# Patient Record
Sex: Female | Born: 1990 | Race: White | Hispanic: No | Marital: Single | Smoking: Former smoker
Health system: Southern US, Community
[De-identification: ages and names within clinical notes are randomized; demographics above are authoritative.]

## PROBLEM LIST (undated history)

## (undated) DIAGNOSIS — K8020 Calculus of gallbladder without cholecystitis without obstruction: Secondary | ICD-10-CM

## (undated) DIAGNOSIS — K219 Gastro-esophageal reflux disease without esophagitis: Secondary | ICD-10-CM

## (undated) HISTORY — PX: NO PAST SURGERIES: SHX2092

---

## 2011-09-07 DIAGNOSIS — E669 Obesity, unspecified: Secondary | ICD-10-CM | POA: Insufficient documentation

## 2013-02-04 ENCOUNTER — Emergency Department: Payer: Self-pay | Admitting: Emergency Medicine

## 2013-02-04 LAB — CBC WITH DIFFERENTIAL/PLATELET
Basophil %: 0.6 %
MCH: 28.8 pg (ref 26.0–34.0)
Monocyte #: 0.6 x10 3/mm (ref 0.2–0.9)
Neutrophil #: 6.7 10*3/uL — ABNORMAL HIGH (ref 1.4–6.5)
Neutrophil %: 61.6 %
Platelet: 290 10*3/uL (ref 150–440)
RBC: 4.78 10*6/uL (ref 3.80–5.20)
RDW: 13.1 % (ref 11.5–14.5)
WBC: 10.8 10*3/uL (ref 3.6–11.0)

## 2013-02-04 LAB — COMPREHENSIVE METABOLIC PANEL
Alkaline Phosphatase: 79 U/L (ref 50–136)
Anion Gap: 5 — ABNORMAL LOW (ref 7–16)
BUN: 12 mg/dL (ref 7–18)
Chloride: 105 mmol/L (ref 98–107)
Co2: 27 mmol/L (ref 21–32)
Creatinine: 0.83 mg/dL (ref 0.60–1.30)
Glucose: 90 mg/dL (ref 65–99)
Potassium: 3.6 mmol/L (ref 3.5–5.1)
SGPT (ALT): 26 U/L (ref 12–78)

## 2016-08-08 ENCOUNTER — Encounter: Payer: Self-pay | Admitting: Internal Medicine

## 2017-05-27 DIAGNOSIS — N96 Recurrent pregnancy loss: Secondary | ICD-10-CM | POA: Insufficient documentation

## 2018-04-12 ENCOUNTER — Other Ambulatory Visit: Payer: Self-pay

## 2018-04-12 DIAGNOSIS — K8020 Calculus of gallbladder without cholecystitis without obstruction: Secondary | ICD-10-CM | POA: Diagnosis not present

## 2018-04-12 DIAGNOSIS — R1011 Right upper quadrant pain: Secondary | ICD-10-CM | POA: Diagnosis present

## 2018-04-12 NOTE — ED Triage Notes (Signed)
Patient reports right rib pain and central upper chest pain.

## 2018-04-13 ENCOUNTER — Emergency Department: Payer: 59

## 2018-04-13 ENCOUNTER — Emergency Department
Admission: EM | Admit: 2018-04-13 | Discharge: 2018-04-13 | Disposition: A | Discharge status: Home / Self Care | Payer: 59 | Attending: Emergency Medicine | Admitting: Emergency Medicine

## 2018-04-13 DIAGNOSIS — R52 Pain, unspecified: Secondary | ICD-10-CM

## 2018-04-13 DIAGNOSIS — K8020 Calculus of gallbladder without cholecystitis without obstruction: Secondary | ICD-10-CM

## 2018-04-13 LAB — CBC
HCT: 39.5 % (ref 36.0–46.0)
Hemoglobin: 13.6 g/dL (ref 12.0–15.0)
MCH: 28.2 pg (ref 26.0–34.0)
MCHC: 34.4 g/dL (ref 30.0–36.0)
MCV: 81.8 fL (ref 80.0–100.0)
Platelets: 318 10*3/uL (ref 150–400)
RBC: 4.83 MIL/uL (ref 3.87–5.11)
RDW: 13.1 % (ref 11.5–15.5)
WBC: 11.1 10*3/uL — ABNORMAL HIGH (ref 4.0–10.5)
nRBC: 0 % (ref 0.0–0.2)

## 2018-04-13 LAB — TROPONIN I: Troponin I: <0.03 ng/mL (ref <0.03)

## 2018-04-13 LAB — BASIC METABOLIC PANEL
Anion gap: 6 (ref 5–15)
BUN: 14 mg/dL (ref 6–20)
CALCIUM: 8.5 mg/dL — AB (ref 8.9–10.3)
CO2: 22 mmol/L (ref 22–32)
Chloride: 108 mmol/L (ref 98–111)
Creatinine, Ser: 0.62 mg/dL (ref 0.44–1.00)
GFR calc Af Amer: >60 mL/min (ref >60)
GFR calc non Af Amer: >60 mL/min (ref >60)
Glucose, Bld: 102 mg/dL — ABNORMAL HIGH (ref 70–99)
Potassium: 3.6 mmol/L (ref 3.5–5.1)
Sodium: 136 mmol/L (ref 135–145)

## 2018-04-13 LAB — POCT PREGNANCY, URINE: Preg Test, Ur: NEGATIVE

## 2018-04-13 MED ORDER — TRAMADOL HCL 50 MG PO TABS: 50.0000 mg | ORAL_TABLET | Freq: Four times a day (QID) | ORAL | 0 refills | Status: DC | PRN

## 2018-04-13 MED ORDER — ONDANSETRON 4 MG PO TBDP: 4.0000 mg | ORAL_TABLET | Freq: Three times a day (TID) | ORAL | 0 refills | Status: DC | PRN

## 2018-04-13 MED ORDER — KETOROLAC TROMETHAMINE 30 MG/ML IJ SOLN
30.0000 mg | Freq: Once | INTRAMUSCULAR | Status: AC
  Administered 2018-04-13: 30 mg via INTRAVENOUS
  Filled 2018-04-13: qty 1

## 2018-04-13 NOTE — ED Provider Notes (Signed)
[facility] Emergency Department Provider Note  ____________________________________________   First MD Initiated Contact with Patient 04/13/18 0335     (approximate)  I have reviewed the triage vital signs and the nursing notes.   HISTORY  Chief Complaint Chest Pain    HPI Becky Hansen is a 27 y.o. female with history of cholelithiasis presents emergency department right upper quadrant abdominal pain with radiation to the central chest.  Patient states that symptoms are consistent with previous episodes of "gallbladder attacks".  Patient states that current pain score is 5 out of 10.  Patient denies any fever.  Patient does admit to occasional nausea no vomiting.  Patient denies any urinary symptoms.  Patient denies any dyspnea.   Past medical history Cholelithiasis There are no active problems to display for this patient.    Prior to Admission medications   Medication Sig Start Date End Date Taking? Authorizing Provider  ondansetron (ZOFRAN ODT) 4 MG disintegrating tablet Take 1 tablet (4 mg total) by mouth every 8 (eight) hours as needed. 04/13/18   Darci CurrentBrown, [place] N, MD  traMADol (ULTRAM) 50 MG tablet Take 1 tablet (50 mg total) by mouth every 6 (six) hours as needed. 04/13/18 04/13/19  Darci CurrentBrown, [place] N, MD    Allergies No known drug allergies No family history on file.  Social History Social History   Tobacco Use  . Smoking status: Not on file  Substance Use Topics  . Alcohol use: Not on file  . Drug use: Not on file    Review of Systems Constitutional: No fever/chills Eyes: No visual changes. ENT: No sore throat. Cardiovascular: Denies chest pain. Respiratory: Denies shortness of breath. Gastrointestinal: Positive for abdominal pain and nausea. Genitourinary: Negative for dysuria. Musculoskeletal: Negative for neck pain.  Negative for back pain. Integumentary: Negative for rash. Neurological: Negative for headaches, focal  weakness or numbness.  ____________________________________________   PHYSICAL EXAM:  VITAL SIGNS: ED Triage Vitals  Enc Vitals Group     BP 04/12/18 2348 134/77     Pulse Rate 04/12/18 2348 60     Resp 04/12/18 2348 20     Temp 04/12/18 2348 98.3 F (36.8 C)     Temp Source 04/12/18 2348 Oral     SpO2 04/12/18 2348 100 %     Weight 04/12/18 2349 108.9 kg (240 lb)     Height 04/12/18 2349 1.676 m (5\' 6" )     Head Circumference --      Peak Flow --      Pain Score 04/12/18 2351 7     Pain Loc --      Pain Edu? --      Excl. in GC? --     Constitutional: Alert and oriented. Well appearing and in no acute distress. Eyes: Conjunctivae are normal.  Ears:  Healthy appearing ear canals and TMs bilaterally Nose: No congestion/rhinnorhea. Mouth/Throat: Mucous membranes are moist. Neck: No stridor.   Cardiovascular: Normal rate, regular rhythm. Good peripheral circulation. Grossly normal heart sounds. Respiratory: Normal respiratory effort.  No retractions. Lungs CTAB. Gastrointestinal: Right upper quadrant tenderness to palpation.  No distention.  Musculoskeletal: No lower extremity tenderness nor edema. No gross deformities of extremities. Neurologic:  Normal speech and language. No gross focal neurologic deficits are appreciated.  Skin:  Skin is warm, dry and intact. No rash noted.   ____________________________________________   LABS (all labs ordered are listed, but only abnormal results are displayed)  Labs Reviewed  BASIC METABOLIC PANEL -  Abnormal; Notable for the following components:      Result Value   Glucose, Bld 102 (*)    Calcium 8.5 (*)    All other components within normal limits  CBC - Abnormal; Notable for the following components:   WBC 11.1 (*)    All other components within normal limits  TROPONIN I  POC URINE PREG, ED  POCT PREGNANCY, URINE   ____________________________________________  EKG  ED ECG REPORT I, [place] N Krystelle Prashad, the attending  physician, personally viewed and interpreted this ECG.   Date: 04/12/2018  EKG Time: 11:52 PM  Rate: 64  Rhythm: Normal sinus rhythm  Axis: Normal  Intervals: Normal  ST&T Change: None  ____________________________________________  RADIOLOGY I, [place] N Varie Machamer, personally viewed and evaluated these images (plain radiographs) as part of my medical decision making, as well as reviewing the written report by the radiologist.  ED MD interpretation: No acute cardiopulmonary process noted on chest x-ray per radiologist Right upper quadrant ultrasound revealed multiple gallstones within the gallbladder without evidence of cholecystitis per radiologist.  Official radiology report(s): Dg Chest 2 View  Result Date: 04/13/2018 CLINICAL DATA:  Central chest pain. EXAM: CHEST - 2 VIEW COMPARISON:  None. FINDINGS: The heart size and mediastinal contours are within normal limits. Both lungs are clear. There is scoliosis of spine. IMPRESSION: No active cardiopulmonary disease. Electronically Signed   By: Sherian ReinWei-Chen  Lin M.D.   On: 04/13/2018 00:29   [place] Abdomen Limited Ruq  Result Date: 04/13/2018 CLINICAL DATA:  Chronic right upper quadrant abdominal pain. EXAM: ULTRASOUND ABDOMEN LIMITED RIGHT UPPER QUADRANT COMPARISON:  None. FINDINGS: Gallbladder: Numerous stones are seen filling the gallbladder, with a wall echo shadow sign. No gallbladder wall thickening or pericholecystic fluid is seen. No ultrasonographic Murphy's sign is elicited. Common bile duct: Diameter: 0.5 cm, within normal limits in caliber. Liver: No focal lesion identified. Increased parenchymal echogenicity and coarsened echotexture likely reflect fatty infiltration. Portal vein is patent on color Doppler imaging with normal direction of blood flow towards the liver. IMPRESSION: 1. No acute abnormality seen to explain the patient's symptoms. 2. Stones seen filling the gallbladder, with a wall echo shadow sign. No definite evidence for  obstruction or cholecystitis. 3. Diffuse fatty infiltration within the liver. Electronically Signed   By: Roanna RaiderJeffery  Chang M.D.   On: 04/13/2018 04:47      Procedures   ____________________________________________   INITIAL IMPRESSION / ASSESSMENT AND PLAN / ED COURSE  As part of my medical decision making, I reviewed the following data within the electronic MEDICAL RECORD NUMBER  27 year old female presenting with above-stated history and physical exam secondary to right upper quadrant abdominal pain.  Concern for cholelithiasis versus cholecystitis and a surgical ultrasound performed which confirmed known history of cholelithiasis without any evidence of cholecystitis.  Patient given Toradol 10 mg tablet with improvement of pain.  Patient be referred to Dr. Aleen CampiPiscoya general surgeon for further outpatient evaluation and management of cholelithiasis.  Discussed warning signs of the patient that warrant immediate return to the emergency department.   ____________________________________________  FINAL CLINICAL IMPRESSION(S) / ED DIAGNOSES  Final diagnoses:  Pain aggravated by eating or drinking  Gallstones     MEDICATIONS GIVEN DURING THIS VISIT:  Medications  ketorolac (TORADOL) 30 MG/ML injection 30 mg (30 mg Intravenous Given 04/13/18 0448)     ED Discharge Orders         Ordered    traMADol (ULTRAM) 50 MG tablet  Every 6 hours PRN  04/13/18 0617    ondansetron (ZOFRAN ODT) 4 MG disintegrating tablet  Every 8 hours PRN     04/13/18 0617           Note:  This document was prepared using Dragon voice recognition software and may include unintentional dictation errors.    Darci Current, MD 04/13/18 [phone]

## 2018-04-22 ENCOUNTER — Encounter: Payer: Self-pay | Admitting: Surgery

## 2018-04-22 ENCOUNTER — Ambulatory Visit (INDEPENDENT_AMBULATORY_CARE_PROVIDER_SITE_OTHER): Payer: 59 | Admitting: Surgery

## 2018-04-22 ENCOUNTER — Telehealth: Payer: Self-pay

## 2018-04-22 ENCOUNTER — Other Ambulatory Visit: Payer: Self-pay

## 2018-04-22 VITALS — BP 116/82 | HR 62 | Temp 98.8°F | Resp 18 | Ht 66.0 in | Wt 250.0 lb

## 2018-04-22 DIAGNOSIS — K8020 Calculus of gallbladder without cholecystitis without obstruction: Secondary | ICD-10-CM | POA: Diagnosis not present

## 2018-04-22 NOTE — H&P (View-Only) (Signed)
04/22/2018  Reason for Visit:  Cholelithiasis  History of Present Illness: Tsion ANYSHA NORDNESS is a 28 y.o. female presenting for evaluation of cholelithiasis.  Patient has a known history of cholelithiasis for several years, but noted that last month she has multiple biliary colic episodes, one of which brought her to the ER on 12/29.  She has labs and ultrasound done which I have independently viewed.  Her ultrasound showed gallstones but no evidence of cholecystitis and her labs were overall unremarkable except for WBC of 11.1.  No LFTs were obtained then.  She reports that her episodes involve right upper quadrant and epigastric pain with nausea but no emesis.  Denies any fevers, chills, chest pain, but reports heartburn.  Has a strong family history of cholelithiasis and multiple members s/p cholecystectomy  Past Medical History: --Cholelithiasis   Past Surgical History: --None  Home Medications: Prior to Admission medications   Medication Sig Start Date End Date Taking? Authorizing Provider  traMADol (ULTRAM) 50 MG tablet Take 1 tablet (50 mg total) by mouth every 6 (six) hours as needed. 04/13/18 04/13/19  Darci Current, MD    Allergies: Allergies  Allergen Reactions  . Other     Social History:  reports that she quit smoking about 3 years ago. She smoked 0.25 packs per day. She has never used smokeless tobacco. She reports current alcohol use. She reports that she does not use drugs.   Family History: Family History  Problem Relation Age of Onset  . Hypertension Mother   . Hypertension Father     Review of Systems: Review of Systems  Constitutional: Negative for chills and fever.  HENT: Negative for hearing loss.   Respiratory: Negative for shortness of breath.   Cardiovascular: Negative for chest pain.  Gastrointestinal: Positive for abdominal pain, heartburn and nausea. Negative for constipation, diarrhea and vomiting.  Genitourinary: Negative for dysuria.   Musculoskeletal: Negative for myalgias.  Skin: Negative for rash.  Neurological: Negative for dizziness.  Psychiatric/Behavioral: Negative for depression.    Physical Exam BP 116/82   Pulse 62   Temp 98.8 F (37.1 C) (Oral)   Resp 18   Ht 5\' 6"  (1.676 m)   Wt 250 lb (113.4 kg)   SpO2 99%   BMI 40.35 kg/m  CONSTITUTIONAL: No acute distress HEENT:  Normocephalic, atraumatic, extraocular motion intact. NECK: Trachea is midline, and there is no jugular venous distension.  RESPIRATORY:  Lungs are clear, and breath sounds are equal bilaterally. Normal respiratory effort without pathologic use of accessory muscles. CARDIOVASCULAR: Heart is regular without murmurs, gallops, or rubs. GI: The abdomen is soft, non-distended, currently non-tender to palpation. MUSCULOSKELETAL:  Normal muscle strength and tone in all four extremities.  No peripheral edema or cyanosis. SKIN: Skin turgor is normal. There are no pathologic skin lesions.  NEUROLOGIC:  Motor and sensation is grossly normal.  Cranial nerves are grossly intact. PSYCH:  Alert and oriented to person, place and time. Affect is normal.  Laboratory Analysis: Na 136, K 3.6, Cl 108, CO2 22, BUN 14, Cr 0.62.  Trop < 0.03.  WBC 11.1, Hgb 13.6, Hct 39.5, Plt 318.  Imaging: Ultrasound 04/13/18: IMPRESSION: 1. No acute abnormality seen to explain the patient's symptoms. 2. Stones seen filling the gallbladder, with a wall echo shadow sign. No definite evidence for obstruction or cholecystitis. 3. Diffuse fatty infiltration within the liver.  Assessment and Plan: This is a 28 y.o. female with symptomatic cholelithiasis.  Discussed with the patient the role for  cholecystectomy and she's seeking surgical management.  Discussed with her laparoscopic cholecystectomy, possible open procedure, including risks of bleeding, infection, and injury to surrounding structures.  She's willing to proceed.  Discussed post-op outcomes, restrictions  including no heavy lifting or pushing of no more than 10-15 lbs for 4 weeks.  She will be scheduled for 05/05/18.  Face-to-face time spent with the patient and care providers was 45 minutes, with more than 50% of the time spent counseling, educating, and coordinating care of the patient.     Howie Ill, MD [facility]

## 2018-04-22 NOTE — Progress Notes (Signed)
04/22/2018  Reason for Visit:  Cholelithiasis  History of Present Illness: Becky Hansen is a 28 y.o. female presenting for evaluation of cholelithiasis.  Patient has a known history of cholelithiasis for several years, but noted that last month she has multiple biliary colic episodes, one of which brought her to the ER on 12/29.  She has labs and ultrasound done which I have independently viewed.  Her ultrasound showed gallstones but no evidence of cholecystitis and her labs were overall unremarkable except for WBC of 11.1.  No LFTs were obtained then.  She reports that her episodes involve right upper quadrant and epigastric pain with nausea but no emesis.  Denies any fevers, chills, chest pain, but reports heartburn.  Has a strong family history of cholelithiasis and multiple members s/p cholecystectomy  Past Medical History: --Cholelithiasis   Past Surgical History: --None  Home Medications: Prior to Admission medications   Medication Sig Start Date End Date Taking? Authorizing Provider  traMADol (ULTRAM) 50 MG tablet Take 1 tablet (50 mg total) by mouth every 6 (six) hours as needed. 04/13/18 04/13/19  Brown, [place] N, MD    Allergies: Allergies  Allergen Reactions  . Other     Social History:  reports that she quit smoking about 3 years ago. She smoked 0.25 packs per day. She has never used smokeless tobacco. She reports current alcohol use. She reports that she does not use drugs.   Family History: Family History  Problem Relation Age of Onset  . Hypertension Mother   . Hypertension Father     Review of Systems: Review of Systems  Constitutional: Negative for chills and fever.  HENT: Negative for hearing loss.   Respiratory: Negative for shortness of breath.   Cardiovascular: Negative for chest pain.  Gastrointestinal: Positive for abdominal pain, heartburn and nausea. Negative for constipation, diarrhea and vomiting.  Genitourinary: Negative for dysuria.   Musculoskeletal: Negative for myalgias.  Skin: Negative for rash.  Neurological: Negative for dizziness.  Psychiatric/Behavioral: Negative for depression.    Physical Exam BP 116/82   Pulse 62   Temp 98.8 F (37.1 C) (Oral)   Resp 18   Ht 5' 6" (1.676 m)   Wt 250 lb (113.4 kg)   SpO2 99%   BMI 40.35 kg/m  CONSTITUTIONAL: No acute distress HEENT:  Normocephalic, atraumatic, extraocular motion intact. NECK: Trachea is midline, and there is no jugular venous distension.  RESPIRATORY:  Lungs are clear, and breath sounds are equal bilaterally. Normal respiratory effort without pathologic use of accessory muscles. CARDIOVASCULAR: Heart is regular without murmurs, gallops, or rubs. GI: The abdomen is soft, non-distended, currently non-tender to palpation. MUSCULOSKELETAL:  Normal muscle strength and tone in all four extremities.  No peripheral edema or cyanosis. SKIN: Skin turgor is normal. There are no pathologic skin lesions.  NEUROLOGIC:  Motor and sensation is grossly normal.  Cranial nerves are grossly intact. PSYCH:  Alert and oriented to person, place and time. Affect is normal.  Laboratory Analysis: Na 136, K 3.6, Cl 108, CO2 22, BUN 14, Cr 0.62.  Trop < 0.03.  WBC 11.1, Hgb 13.6, Hct 39.5, Plt 318.  Imaging: Ultrasound 04/13/18: IMPRESSION: 1. No acute abnormality seen to explain the patient's symptoms. 2. Stones seen filling the gallbladder, with a wall echo shadow sign. No definite evidence for obstruction or cholecystitis. 3. Diffuse fatty infiltration within the liver.  Assessment and Plan: This is a 28 y.o. female with symptomatic cholelithiasis.  Discussed with the patient the role for   cholecystectomy and she's seeking surgical management.  Discussed with her laparoscopic cholecystectomy, possible open procedure, including risks of bleeding, infection, and injury to surrounding structures.  She's willing to proceed.  Discussed post-op outcomes, restrictions  including no heavy lifting or pushing of no more than 10-15 lbs for 4 weeks.  She will be scheduled for 05/05/18.  Face-to-face time spent with the patient and care providers was 45 minutes, with more than 50% of the time spent counseling, educating, and coordinating care of the patient.     Howie Ill, MD [facility]

## 2018-04-22 NOTE — Patient Instructions (Signed)
You have requested to have your gallbladder removed. This will be done on 05/05/2018  at [facility] with Dr. Henrene Dodge.  You will most likely be out of work 1-2 weeks for this surgery. You will return after your post-op appointment with a lifting restriction for approximately 4 more weeks.  You will be able to eat anything you would like to following surgery. But, start by eating a bland diet and advance this as tolerated. The Gallbladder diet is below, please go as closely by this diet as possible prior to surgery to avoid any further attacks.  Please see the (blue)pre-care form that you have been given today. If you have any questions, please call our office.  Laparoscopic Cholecystectomy Laparoscopic cholecystectomy is surgery to remove the gallbladder. The gallbladder is located in the upper right part of the abdomen, behind the liver. It is a storage sac for bile, which is produced in the liver. Bile aids in the digestion and absorption of fats. Cholecystectomy is often done for inflammation of the gallbladder (cholecystitis). This condition is usually caused by a buildup of gallstones (cholelithiasis) in the gallbladder. Gallstones can block the flow of bile, and that can result in inflammation and pain. In severe cases, emergency surgery may be required. If emergency surgery is not required, you will have time to prepare for the procedure. Laparoscopic surgery is an alternative to open surgery. Laparoscopic surgery has a shorter recovery time. Your common bile duct may also need to be examined during the procedure. If stones are found in the common bile duct, they may be removed. LET [facility] CARE PROVIDER KNOW ABOUT:  Any allergies you have.  All medicines you are taking, including vitamins, herbs, eye drops, creams, and over-the-counter medicines.  Previous problems you or members of your family have had with the use of anesthetics.  Any blood disorders you have.  Previous  surgeries you have had.    Any medical conditions you have. RISKS AND COMPLICATIONS Generally, this is a safe procedure. However, problems may occur, including:  Infection.  Bleeding.  Allergic reactions to medicines.  Damage to other structures or organs.  A stone remaining in the common bile duct.  A bile leak from the cyst duct that is clipped when your gallbladder is removed.  The need to convert to open surgery, which requires a larger incision in the abdomen. This may be necessary if your surgeon thinks that it is not safe to continue with a laparoscopic procedure. BEFORE THE PROCEDURE  Ask your health care provider about:  Changing or stopping your regular medicines. This is especially important if you are taking diabetes medicines or blood thinners.  Taking medicines such as aspirin and ibuprofen. These medicines can thin your blood. Do not take these medicines before your procedure if your health care provider instructs you not to.  Follow instructions from your health care provider about eating or drinking restrictions.  Let your health care provider know if you develop a cold or an infection before surgery.  Plan to have someone take you home after the procedure.  Ask your health care provider how your surgical site will be marked or identified.  You may be given antibiotic medicine to help prevent infection. PROCEDURE  To reduce your risk of infection:  Your health care team will wash or sanitize their hands.  Your skin will be washed with soap.  An IV tube may be inserted into one of your veins.  You will be given a medicine  to make you fall asleep (general anesthetic).  A breathing tube will be placed in your mouth.  The surgeon will make several small cuts (incisions) in your abdomen.  A thin, lighted tube (laparoscope) that has a tiny camera on the end will be inserted through one of the small incisions. The camera on the laparoscope will send a  picture to a TV screen (monitor) in the operating room. This will give the surgeon a good view inside your abdomen.  A gas will be pumped into your abdomen. This will expand your abdomen to give the surgeon more room to perform the surgery.  Other tools that are needed for the procedure will be inserted through the other incisions. The gallbladder will be removed through one of the incisions.  After your gallbladder has been removed, the incisions will be closed with stitches (sutures), staples, or skin glue.  Your incisions may be covered with a bandage (dressing). The procedure may vary among health care providers and hospitals. AFTER THE PROCEDURE  Your blood pressure, heart rate, breathing rate, and blood oxygen level will be monitored often until the medicines you were given have worn off.  You will be given medicines as needed to control your pain.   This information is not intended to replace advice given to you by your health care provider. Make sure you discuss any questions you have with your health care provider.   Document Released: 04/02/2005 Document Revised: 12/22/2014 Document Reviewed: 11/12/2012 Elsevier Interactive Patient Education 2016 [place] Diet for Gallbladder Conditions A low-fat diet can be helpful if you have pancreatitis or a gallbladder condition. With these conditions, your pancreas and gallbladder have trouble digesting fats. A healthy eating plan with less fat will help rest your pancreas and gallbladder and reduce your symptoms. WHAT DO I NEED TO KNOW ABOUT THIS DIET?  Eat a low-fat diet.  Reduce your fat intake to less than 20-30% of your total daily calories. This is less than 50-60 g of fat per day.  Remember that you need some fat in your diet. Ask your dietician what your daily goal should be.  Choose nonfat and low-fat healthy foods. Look for the words "nonfat," "low fat," or "fat free."  As a guide, look on the label and  choose foods with less than 3 g of fat per serving. Eat only one serving.  Avoid alcohol.  Do not smoke. If you need help quitting, talk with your health care provider.  Eat small frequent meals instead of three large heavy meals. WHAT FOODS CAN I EAT? Grains Include healthy grains and starches such as potatoes, wheat bread, fiber-rich cereal, and brown rice. Choose whole grain options whenever possible. In adults, whole grains should account for 45-65% of your daily calories.  Fruits and Vegetables Eat plenty of fruits and vegetables. Fresh fruits and vegetables add fiber to your diet. Meats and Other Protein Sources Eat lean meat such as chicken and pork. Trim any fat off of meat before cooking it. Eggs, fish, and beans are other sources of protein. In adults, these foods should account for 10-35% of your daily calories. Dairy Choose low-fat milk and dairy options. Dairy includes fat and protein, as well as calcium.  Fats and Oils Limit high-fat foods such as fried foods, sweets, baked goods, sugary drinks.  Other Creamy sauces and condiments, such as mayonnaise, can add extra fat. Think about whether or not you need to use them, or use smaller amounts or low  fat options. WHAT FOODS ARE NOT RECOMMENDED?  High fat foods, such as:  [employer].  Ice cream.  [place] toast.  Sweet rolls.  Pizza.  Cheese bread.  Foods covered with batter, butter, creamy sauces, or cheese.  Fried foods.  Sugary drinks and desserts.  Foods that cause gas or bloating   This information is not intended to replace advice given to you by your health care provider. Make sure you discuss any questions you have with your health care provider.   Document Released: 04/07/2013 Document Reviewed: 04/07/2013 Elsevier Interactive Patient Education [employer]

## 2018-04-22 NOTE — Telephone Encounter (Signed)
Message left for patient to call to review pre surgery instructions.  The patient is scheduled for surgery at [facility] on 05/05/18 with Dr Aleen Campi. She will pre admit by phone and will call on 05/02/18 to get her arrival time for surgery on 05/05/18.

## 2018-04-23 NOTE — Telephone Encounter (Signed)
Spoke with the patient about surgery instructions. She is aware to call the Friday before her surgery for her arrival time. She is aware of date and instructions.

## 2018-05-01 ENCOUNTER — Encounter
Admission: RE | Admit: 2018-05-01 | Discharge: 2018-05-01 | Disposition: A | Discharge status: Home / Self Care | Payer: 59 | Source: Ambulatory Visit | Attending: Surgery | Admitting: Surgery

## 2018-05-01 ENCOUNTER — Other Ambulatory Visit: Payer: Self-pay

## 2018-05-01 HISTORY — DX: Gastro-esophageal reflux disease without esophagitis: K21.9

## 2018-05-01 HISTORY — DX: Calculus of gallbladder without cholecystitis without obstruction: K80.20

## 2018-05-01 NOTE — Patient Instructions (Addendum)
Your procedure is scheduled on: 05-05-18 Report to Same Day Surgery 2nd floor medical mall [facility](Medical Mall Entrance-take elevator on left to 2nd floor.  Check in with surgery information desk.) To find out your arrival time please call [phone] between 1PM - 3PM on 05-02-18  Remember: Instructions that are not followed completely may result in serious medical risk, up to and including death, or upon the discretion of your surgeon and anesthesiologist your surgery may need to be rescheduled.    _x___ 1. Do not eat food after midnight the night before your procedure. You may drink clear liquids up to 2 hours before you are scheduled to arrive at the hospital for your procedure.  Do not drink clear liquids within 2 hours of your scheduled arrival to the hospital.  Clear liquids include  --Water or Apple juice without pulp  --Clear carbohydrate beverage such as ClearFast or Gatorade  --Black Coffee or Clear Tea (No milk, no creamers, do not add anything to the coffee or Tea   ____Ensure clear carbohydrate drink on the way to the hospital for bariatric patients  ____Ensure clear carbohydrate drink 3 hours before surgery for Dr Rutherford NailByrnett's patients if physician instructed.   No gum chewing or hard candies.     __x__ 2. No Alcohol for 24 hours before or after surgery.   __x__3. No Smoking or e-cigarettes for 24 prior to surgery.  Do not use any chewable tobacco products for at least 6 hour prior to surgery   ____  4. Bring all medications with you on the day of surgery if instructed.    __x__ 5. Notify your doctor if there is any change in your medical condition     (cold, fever, infections).    x___6. On the morning of surgery brush your teeth with toothpaste and water.  You may rinse your mouth with mouth wash if you wish.  Do not swallow any toothpaste or mouthwash.   Do not wear jewelry, make-up, hairpins, clips or nail polish.  Do not wear lotions, powders, or perfumes. You may wear  deodorant.  Do not shave 48 hours prior to surgery. Men may shave face and neck.  Do not bring valuables to the hospital.    [facility] is not responsible for any belongings or valuables.               Contacts, dentures or bridgework may not be worn into surgery.  Leave your suitcase in the car. After surgery it may be brought to your room.  For patients admitted to the hospital, discharge time is determined by your                       treatment team.  _  Patients discharged the day of surgery will not be allowed to drive home.  You will need someone to drive you home and stay with you the night of your procedure.    Please read over the following fact sheets that you were given:   [facility] Preparing for Surgery and or MRSA Information   _x___ TAKE THE FOLLOWING MEDICATION THE MORNING OF SURGERY WITH A SMALL SIP OF WATER. These include:  1. OMEPRAZOLE  2.TAKE AN OMEPRAZOLE THE NIGHT BEFORE YOUR SURGERY  3.  4.  5.  6.  ____Fleets enema or Magnesium Citrate as directed.   ____ Use CHG Soap or sage wipes as directed on instruction sheet   ____ Use inhalers on the day of  surgery and bring to hospital day of surgery  ____ Stop Metformin and Janumet 2 days prior to surgery.    ____ Take 1/2 of usual insulin dose the night before surgery and none on the morning     surgery.   ____ Follow recommendations from Cardiologist, Pulmonologist or PCP regarding          stopping Aspirin, Coumadin, Plavix ,Eliquis, Effient, or Pradaxa, and Pletal.  X____Stop Anti-inflammatories such as Advil, Aleve, Ibuprofen, Motrin, Naproxen, Naprosyn, Goodies powders or aspirin products NOW- OK to take Tylenol OR TRAMADOL IF NEEDED   ____ Stop supplements until after surgery   ____ Bring C-Pap to the hospital.

## 2018-05-04 MED ORDER — CEFAZOLIN SODIUM-DEXTROSE 2-4 GM/100ML-% IV SOLN: 2.0000 g | INTRAVENOUS | Status: DC

## 2018-05-05 ENCOUNTER — Encounter: Payer: Self-pay | Admitting: *Deleted

## 2018-05-05 ENCOUNTER — Ambulatory Visit: Payer: 59 | Admitting: Anesthesiology

## 2018-05-05 ENCOUNTER — Ambulatory Visit
Admission: RE | Admit: 2018-05-05 | Discharge: 2018-05-05 | Disposition: A | Discharge status: Home / Self Care | Payer: 59 | Attending: Surgery | Admitting: Surgery

## 2018-05-05 ENCOUNTER — Encounter
Admission: RE | Disposition: A | Discharge status: Home / Self Care | Payer: Self-pay | Source: Home / Self Care | Attending: Surgery

## 2018-05-05 ENCOUNTER — Other Ambulatory Visit: Payer: Self-pay

## 2018-05-05 DIAGNOSIS — K828 Other specified diseases of gallbladder: Secondary | ICD-10-CM | POA: Insufficient documentation

## 2018-05-05 DIAGNOSIS — K8010 Calculus of gallbladder with chronic cholecystitis without obstruction: Secondary | ICD-10-CM | POA: Diagnosis present

## 2018-05-05 DIAGNOSIS — K8020 Calculus of gallbladder without cholecystitis without obstruction: Secondary | ICD-10-CM

## 2018-05-05 DIAGNOSIS — K219 Gastro-esophageal reflux disease without esophagitis: Secondary | ICD-10-CM | POA: Insufficient documentation

## 2018-05-05 DIAGNOSIS — Z87891 Personal history of nicotine dependence: Secondary | ICD-10-CM | POA: Diagnosis not present

## 2018-05-05 DIAGNOSIS — Z8249 Family history of ischemic heart disease and other diseases of the circulatory system: Secondary | ICD-10-CM | POA: Diagnosis not present

## 2018-05-05 DIAGNOSIS — K760 Fatty (change of) liver, not elsewhere classified: Secondary | ICD-10-CM | POA: Insufficient documentation

## 2018-05-05 HISTORY — PX: CHOLECYSTECTOMY: SHX55

## 2018-05-05 LAB — POCT PREGNANCY, URINE: Preg Test, Ur: NEGATIVE

## 2018-05-05 SURGERY — LAPAROSCOPIC CHOLECYSTECTOMY
Anesthesia: General

## 2018-05-05 MED ORDER — SUGAMMADEX SODIUM 200 MG/2ML IV SOLN
INTRAVENOUS | Status: DC | PRN
  Administered 2018-05-05: 400 mg via INTRAVENOUS

## 2018-05-05 MED ORDER — BUPIVACAINE-EPINEPHRINE (PF) 0.25% -1:200000 IJ SOLN
INTRAMUSCULAR | Status: DC | PRN
  Administered 2018-05-05: 30 mL

## 2018-05-05 MED ORDER — FENTANYL CITRATE (PF) 250 MCG/5ML IJ SOLN
INTRAMUSCULAR | Status: AC
  Filled 2018-05-05: qty 5

## 2018-05-05 MED ORDER — OXYCODONE HCL 5 MG PO TABS
5.0000 mg | ORAL_TABLET | Freq: Once | ORAL | Status: AC | PRN
  Administered 2018-05-05: 5 mg via ORAL

## 2018-05-05 MED ORDER — MIDAZOLAM HCL 2 MG/2ML IJ SOLN
INTRAMUSCULAR | Status: DC | PRN
  Administered 2018-05-05: 2 mg via INTRAVENOUS

## 2018-05-05 MED ORDER — OXYCODONE HCL 5 MG PO TABS
ORAL_TABLET | ORAL | Status: AC
  Filled 2018-05-05: qty 1

## 2018-05-05 MED ORDER — DEXAMETHASONE SODIUM PHOSPHATE 10 MG/ML IJ SOLN
INTRAMUSCULAR | Status: DC | PRN
  Administered 2018-05-05: 10 mg via INTRAVENOUS

## 2018-05-05 MED ORDER — MIDAZOLAM HCL 2 MG/2ML IJ SOLN
INTRAMUSCULAR | Status: AC
  Filled 2018-05-05: qty 2

## 2018-05-05 MED ORDER — FENTANYL CITRATE (PF) 100 MCG/2ML IJ SOLN
INTRAMUSCULAR | Status: DC | PRN
  Administered 2018-05-05: 50 ug via INTRAVENOUS
  Administered 2018-05-05: 100 ug via INTRAVENOUS

## 2018-05-05 MED ORDER — IBUPROFEN 600 MG PO TABS: 600.0000 mg | ORAL_TABLET | Freq: Three times a day (TID) | ORAL | 0 refills | Status: AC | PRN

## 2018-05-05 MED ORDER — OXYCODONE HCL 5 MG PO TABS: 5.0000 mg | ORAL_TABLET | ORAL | 0 refills | Status: DC | PRN

## 2018-05-05 MED ORDER — ROCURONIUM BROMIDE 100 MG/10ML IV SOLN
INTRAVENOUS | Status: DC | PRN
  Administered 2018-05-05: 10 mg via INTRAVENOUS
  Administered 2018-05-05: 40 mg via INTRAVENOUS

## 2018-05-05 MED ORDER — KETOROLAC TROMETHAMINE 30 MG/ML IJ SOLN
INTRAMUSCULAR | Status: DC | PRN
  Administered 2018-05-05: 30 mg via INTRAVENOUS

## 2018-05-05 MED ORDER — EPHEDRINE SULFATE 50 MG/ML IJ SOLN
INTRAMUSCULAR | Status: DC | PRN
  Administered 2018-05-05: 10 mg via INTRAVENOUS

## 2018-05-05 MED ORDER — BUPIVACAINE-EPINEPHRINE (PF) 0.25% -1:200000 IJ SOLN
INTRAMUSCULAR | Status: AC
  Filled 2018-05-05: qty 30

## 2018-05-05 MED ORDER — CHLORHEXIDINE GLUCONATE CLOTH 2 % EX PADS: 6.0000 | MEDICATED_PAD | Freq: Once | CUTANEOUS | Status: DC

## 2018-05-05 MED ORDER — GABAPENTIN 300 MG PO CAPS
ORAL_CAPSULE | ORAL | Status: AC
  Administered 2018-05-05: 300 mg via ORAL
  Filled 2018-05-05: qty 1

## 2018-05-05 MED ORDER — OXYCODONE HCL 5 MG/5ML PO SOLN: 5.0000 mg | Freq: Once | ORAL | Status: AC | PRN

## 2018-05-05 MED ORDER — PROPOFOL 10 MG/ML IV BOLUS
INTRAVENOUS | Status: DC | PRN
  Administered 2018-05-05: 150 mg via INTRAVENOUS

## 2018-05-05 MED ORDER — ACETAMINOPHEN 500 MG PO TABS
1000.0000 mg | ORAL_TABLET | ORAL | Status: AC
  Administered 2018-05-05: 1000 mg via ORAL

## 2018-05-05 MED ORDER — LACTATED RINGERS IV SOLN
INTRAVENOUS | Status: DC
  Administered 2018-05-05: 13:00:00 via INTRAVENOUS
  Administered 2018-05-05: 50 mL/h via INTRAVENOUS

## 2018-05-05 MED ORDER — GABAPENTIN 300 MG PO CAPS
300.0000 mg | ORAL_CAPSULE | ORAL | Status: AC
  Administered 2018-05-05: 300 mg via ORAL

## 2018-05-05 MED ORDER — CEFAZOLIN SODIUM-DEXTROSE 2-4 GM/100ML-% IV SOLN
INTRAVENOUS | Status: AC
  Filled 2018-05-05: qty 100

## 2018-05-05 MED ORDER — PROPOFOL 10 MG/ML IV BOLUS
INTRAVENOUS | Status: AC
  Filled 2018-05-05: qty 20

## 2018-05-05 MED ORDER — LIDOCAINE HCL (CARDIAC) PF 100 MG/5ML IV SOSY
PREFILLED_SYRINGE | INTRAVENOUS | Status: DC | PRN
  Administered 2018-05-05: 80 mg via INTRAVENOUS

## 2018-05-05 MED ORDER — ACETAMINOPHEN 500 MG PO TABS
ORAL_TABLET | ORAL | Status: AC
  Filled 2018-05-05: qty 2

## 2018-05-05 MED ORDER — LIDOCAINE HCL (PF) 2 % IJ SOLN
INTRAMUSCULAR | Status: AC
  Filled 2018-05-05: qty 10

## 2018-05-05 MED ORDER — FENTANYL CITRATE (PF) 100 MCG/2ML IJ SOLN
INTRAMUSCULAR | Status: AC
  Administered 2018-05-05: 25 ug via INTRAVENOUS
  Filled 2018-05-05: qty 2

## 2018-05-05 MED ORDER — ONDANSETRON HCL 4 MG/2ML IJ SOLN
INTRAMUSCULAR | Status: DC | PRN
  Administered 2018-05-05: 4 mg via INTRAVENOUS

## 2018-05-05 MED ORDER — FENTANYL CITRATE (PF) 100 MCG/2ML IJ SOLN
25.0000 ug | INTRAMUSCULAR | Status: DC | PRN
  Administered 2018-05-05 (×4): 25 ug via INTRAVENOUS

## 2018-05-05 SURGICAL SUPPLY — 45 items
APPLIER CLIP 5 13 M/L LIGAMAX5 (MISCELLANEOUS) ×3
BLADE SURG 15 STRL LF DISP TIS (BLADE) ×1 IMPLANT
BLADE SURG 15 STRL SS (BLADE) ×2
CANISTER SUCT 1200ML W/VALVE (MISCELLANEOUS) ×3 IMPLANT
CATH CHOLANGI 4FR 420404F (CATHETERS) IMPLANT
CHLORAPREP W/TINT 26ML (MISCELLANEOUS) ×3 IMPLANT
CLIP APPLIE 5 13 M/L LIGAMAX5 (MISCELLANEOUS) ×1 IMPLANT
CONRAY 60ML FOR OR (MISCELLANEOUS) ×1 IMPLANT
COVER WAND RF STERILE (DRAPES) ×1 IMPLANT
DERMABOND ADVANCED (GAUZE/BANDAGES/DRESSINGS) ×2
DERMABOND ADVANCED .7 DNX12 (GAUZE/BANDAGES/DRESSINGS) ×1 IMPLANT
DRAPE C-ARM XRAY 36X54 (DRAPES) IMPLANT
ELECT REM PT RETURN 9FT ADLT (ELECTROSURGICAL) ×3
ELECTRODE REM PT RTRN 9FT ADLT (ELECTROSURGICAL) ×1 IMPLANT
FILTER LAP SMOKE EVAC STRL (MISCELLANEOUS) ×3 IMPLANT
GLOVE SURG SYN 7.0 (GLOVE) ×6 IMPLANT
GLOVE SURG SYN 7.0 PF PI (GLOVE) ×1 IMPLANT
GLOVE SURG SYN 7.5  E (GLOVE) ×4
GLOVE SURG SYN 7.5 E (GLOVE) ×2 IMPLANT
GLOVE SURG SYN 7.5 PF PI (GLOVE) ×1 IMPLANT
GOWN STRL REUS W/ TWL LRG LVL3 (GOWN DISPOSABLE) ×3 IMPLANT
GOWN STRL REUS W/TWL LRG LVL3 (GOWN DISPOSABLE) ×4
IRRIGATION STRYKERFLOW (MISCELLANEOUS) IMPLANT
IRRIGATOR STRYKERFLOW (MISCELLANEOUS)
IV CATH ANGIO 12GX3 LT BLUE (NEEDLE) ×1 IMPLANT
IV NS 1000ML (IV SOLUTION)
IV NS 1000ML BAXH (IV SOLUTION) IMPLANT
JACKSON PRATT 10 (INSTRUMENTS) IMPLANT
L-HOOK LAP DISP 36CM (ELECTROSURGICAL) ×3
LABEL OR SOLS (LABEL) ×3 IMPLANT
LHOOK LAP DISP 36CM (ELECTROSURGICAL) ×1 IMPLANT
NEEDLE HYPO 22GX1.5 SAFETY (NEEDLE) ×6 IMPLANT
PACK LAP CHOLECYSTECTOMY (MISCELLANEOUS) ×3 IMPLANT
PENCIL ELECTRO HAND CTR (MISCELLANEOUS) ×3 IMPLANT
POUCH SPECIMEN RETRIEVAL 10MM (ENDOMECHANICALS) ×3 IMPLANT
SCISSORS METZENBAUM CVD 33 (INSTRUMENTS) ×3 IMPLANT
SLEEVE ADV FIXATION 5X100MM (TROCAR) ×9 IMPLANT
SPONGE VERSALON 4X4 4PLY (MISCELLANEOUS) IMPLANT
SUT MNCRL 4-0 (SUTURE) ×2
SUT MNCRL 4-0 27XMFL (SUTURE) ×1
SUT VICRYL 0 AB UR-6 (SUTURE) ×3 IMPLANT
SUTURE MNCRL 4-0 27XMF (SUTURE) ×1 IMPLANT
TROCAR BALLN GELPORT 12X130M (ENDOMECHANICALS) ×3 IMPLANT
TROCAR Z-THREAD OPTICAL 5X100M (TROCAR) ×3 IMPLANT
TUBING INSUFFLATION (TUBING) ×3 IMPLANT

## 2018-05-05 NOTE — Transfer of Care (Signed)
Immediate Anesthesia Transfer of Care Note  Patient: Becky Hansen  Procedure(s) Performed: LAPAROSCOPIC CHOLECYSTECTOMY (N/A )  Patient Location: PACU  Anesthesia Type:General  Level of Consciousness: awake and alert   Airway & Oxygen Therapy: Patient Spontanous Breathing and Patient connected to face mask oxygen  Post-op Assessment: Report given to RN and Post -op Vital signs reviewed and stable  Post vital signs: Reviewed and stable  Last Vitals:  Vitals Value Taken Time  BP 137/71 05/05/2018  2:22 PM  Temp    Pulse 100 05/05/2018  2:23 PM  Resp 20 05/05/2018  2:23 PM  SpO2 100 % 05/05/2018  2:23 PM  Vitals shown include unvalidated device data.  Last Pain:  Vitals:   05/05/18 1145  TempSrc: Oral  PainSc: 0-No pain      Patients Stated Pain Goal: 0 (05/05/18 1145)  Complications: No apparent anesthesia complications

## 2018-05-05 NOTE — Interval H&P Note (Signed)
History and Physical Interval Note:  05/05/2018 12:29 PM  Becky Hansen  has presented today for surgery, with the diagnosis of Symptomatic cholelithiasis  The various methods of treatment have been discussed with the patient and family. After consideration of risks, benefits and other options for treatment, the patient has consented to  Procedure(s): LAPAROSCOPIC CHOLECYSTECTOMY (N/A) as a surgical intervention .  The patient's history has been reviewed, patient examined, no change in status, stable for surgery.  I have reviewed the patient's chart and labs.  Questions were answered to the patient's satisfaction.     [name]

## 2018-05-05 NOTE — Discharge Instructions (Signed)
AMBULATORY SURGERY  DISCHARGE INSTRUCTIONS   1) The drugs that you were given will stay in your system until tomorrow so for the next 24 hours you should not:  A) Drive an automobile B) Make any legal decisions C) Drink any alcoholic beverage   2) You may resume regular meals tomorrow.  Today it is better to start with liquids and gradually work up to solid foods.  You may eat anything you prefer, but it is better to start with liquids, then soup and crackers, and gradually work up to solid foods.   3) Please notify your doctor immediately if you have any unusual bleeding, trouble breathing, redness and pain at the surgery site, drainage, fever, or pain not relieved by medication. 4)    5) Additional Instructions:

## 2018-05-05 NOTE — Anesthesia Preprocedure Evaluation (Signed)
Anesthesia Evaluation  Patient identified by MRN, date of birth, ID band Patient awake    Reviewed: Allergy & Precautions, H&P , NPO status , Patient's Chart, lab work & pertinent test results  History of Anesthesia Complications Negative for: history of anesthetic complications  Airway Mallampati: III  TM Distance: <3 FB Neck ROM: full    Dental  (+) Chipped, Poor Dentition   Pulmonary neg shortness of breath, Current Smoker, former smoker,           Cardiovascular Exercise Tolerance: Good (-) angina(-) Past MI and (-) DOE negative cardio ROS       Neuro/Psych negative neurological ROS  negative psych ROS   GI/Hepatic Neg liver ROS, GERD  Medicated and Controlled,  Endo/Other  negative endocrine ROS  Renal/GU      Musculoskeletal   Abdominal   Peds  Hematology negative hematology ROS (+)   Anesthesia Other Findings Past Medical History: No date: Cholelithiasis No date: GERD (gastroesophageal reflux disease)  Past Surgical History: No date: NO PAST SURGERIES  BMI    Body Mass Index:  40.32 kg/m      Reproductive/Obstetrics negative OB ROS                             Anesthesia Physical Anesthesia Plan  ASA: III  Anesthesia Plan: General ETT   Post-op Pain Management:    Induction: Intravenous  PONV Risk Score and Plan: Ondansetron, Dexamethasone, Midazolam and Treatment may vary due to age or medical condition  Airway Management Planned: Oral ETT  Additional Equipment:   Intra-op Plan:   Post-operative Plan: Extubation in OR  Informed Consent: I have reviewed the patients History and Physical, chart, labs and discussed the procedure including the risks, benefits and alternatives for the proposed anesthesia with the patient or authorized representative who has indicated his/her understanding and acceptance.     Dental Advisory Given  Plan Discussed with:  Anesthesiologist, CRNA and Surgeon  Anesthesia Plan Comments: (Patient consented for risks of anesthesia including but not limited to:  - adverse reactions to medications - damage to teeth, lips or other oral mucosa - sore throat or hoarseness - Damage to heart, brain, lungs or loss of life  Patient voiced understanding.)        Anesthesia Quick Evaluation

## 2018-05-05 NOTE — Anesthesia Post-op Follow-up Note (Signed)
Anesthesia QCDR form completed.        

## 2018-05-05 NOTE — Op Note (Signed)
  Procedure Date:  05/05/2018  Pre-operative Diagnosis:  Symptomatic cholelithiasis  Post-operative Diagnosis:  Symptomatic cholelithiasis  Procedure:  Laparoscopic cholecystectomy  Surgeon:  Howie Ill, MD  Anesthesia:  General endotracheal  Estimated Blood Loss:  10 ml  Specimens:  gallbladder  Complications:  None  Indications for Procedure:  This is a 28 y.o. female who presents with abdominal pain and workup revealing symptomatic cholelithiasis.  The benefits, complications, treatment options, and expected outcomes were discussed with the patient. The risks of bleeding, infection, recurrence of symptoms, failure to resolve symptoms, bile duct damage, bile duct leak, retained common bile duct stone, bowel injury, and need for further procedures were all discussed with the patient and she was willing to proceed.  Description of Procedure: The patient was correctly identified in the preoperative area and brought into the operating room.  The patient was placed supine with VTE prophylaxis in place.  Appropriate time-outs were performed.  Anesthesia was induced and the patient was intubated.  Appropriate antibiotics were infused.  The abdomen was prepped and draped in a sterile fashion. An infraumbilical incision was made. A cutdown technique was used to enter the abdominal cavity without injury, and a Hasson trocar was inserted.  Pneumoperitoneum was obtained with appropriate opening pressures.  A 5-mm port was placed in the subxiphoid area and two 5-mm ports were placed in the right upper quadrant under direct visualization.  The gallbladder was identified.  The fundus was grasped and retracted cephalad.  Adhesions were lysed bluntly and with electrocautery. The infundibulum was grasped and retracted laterally, exposing the peritoneum overlying the gallbladder.  This was incised with electrocautery and extended on either side of the gallbladder.  The cystic duct and cystic artery  were clearly identified and bluntly dissected.  Both were clipped twice proximally and once distally, cutting in between.  The gallbladder was taken from the gallbladder fossa in a retrograde fashion with electrocautery. The gallbladder was placed in an Endocatch bag. The liver bed was inspected and any bleeding was controlled with electrocautery. The right upper quadrant was then inspected again revealing intact clips, no bleeding, and no ductal injury.  The 5 mm ports were removed under direct visualization and the Hasson trocar was removed.  The Endocatch bag was brought out via the umbilical incision. The fascial opening was closed using 0 vicryl suture.  Local anesthetic was infused in all incisions and the incisions were closed with 4-0 Monocryl.  The wounds were cleaned and sealed with DermaBond.  The patient was emerged from anesthesia and extubated and brought to the recovery room for further management.  The patient tolerated the procedure well and all counts were correct at the end of the case.   Howie Ill, MD

## 2018-05-05 NOTE — Anesthesia Postprocedure Evaluation (Signed)
Anesthesia Post Note  Patient: Arieliz M Kampe  Procedure(s) Performed: LAPAROSCOPIC CHOLECYSTECTOMY (N/A )  Patient location during evaluation: PACU Anesthesia Type: General Level of consciousness: awake and alert Pain management: pain level controlled Vital Signs Assessment: post-procedure vital signs reviewed and stable Respiratory status: spontaneous breathing, nonlabored ventilation, respiratory function stable and patient connected to nasal cannula oxygen Cardiovascular status: blood pressure returned to baseline and stable Postop Assessment: no apparent nausea or vomiting Anesthetic complications: no     Last Vitals:  Vitals:   05/05/18 1437 05/05/18 1452  BP: 130/74 119/74  Pulse: 61 63  Resp: 13 12  Temp:    SpO2: 100% 100%    Last Pain:  Vitals:   05/05/18 1452  TempSrc:   PainSc: Asleep                 Cleda Mccreedy Tarry Blayney

## 2018-05-05 NOTE — Anesthesia Procedure Notes (Signed)
Procedure Name: Intubation Date/Time: 05/05/2018 1:05 PM Performed by: Henrietta HooverPope, [place] Current, CRNA Pre-anesthesia Checklist: Patient identified, Patient being monitored, Timeout performed, Emergency Drugs available and Suction available Patient Re-evaluated:Patient Re-evaluated prior to induction Oxygen Delivery Method: Circle system utilized Preoxygenation: Pre-oxygenation with 100% oxygen Induction Type: IV induction Ventilation: Mask ventilation without difficulty Laryngoscope Size: 3 and McGraph Grade View: Grade I Tube type: Oral Tube size: 7.0 mm Number of attempts: 1 Airway Equipment and Method: Stylet Placement Confirmation: ETT inserted through vocal cords under direct vision,  positive ETCO2 and breath sounds checked- equal and bilateral Secured at: 21 cm Tube secured with: Tape Dental Injury: Teeth and Oropharynx as per pre-operative assessment

## 2018-05-06 ENCOUNTER — Encounter: Payer: Self-pay | Admitting: Surgery

## 2018-05-07 LAB — SURGICAL PATHOLOGY

## 2018-05-20 ENCOUNTER — Encounter: Payer: 59 | Admitting: Surgery

## 2018-05-23 ENCOUNTER — Ambulatory Visit (INDEPENDENT_AMBULATORY_CARE_PROVIDER_SITE_OTHER): Payer: 59 | Admitting: Surgery

## 2018-05-23 ENCOUNTER — Other Ambulatory Visit: Payer: Self-pay

## 2018-05-23 ENCOUNTER — Encounter: Payer: Self-pay | Admitting: Surgery

## 2018-05-23 VITALS — BP 122/78 | HR 74 | Temp 97.5°F | Ht 66.0 in | Wt 247.0 lb

## 2018-05-23 DIAGNOSIS — K8020 Calculus of gallbladder without cholecystitis without obstruction: Secondary | ICD-10-CM

## 2018-05-23 DIAGNOSIS — Z09 Encounter for follow-up examination after completed treatment for conditions other than malignant neoplasm: Secondary | ICD-10-CM

## 2018-05-23 NOTE — Patient Instructions (Signed)

## 2018-05-23 NOTE — Progress Notes (Signed)
05/23/2018  HPI: Becky Hansen is a 28 y.o. female s/p laparoscopic cholecystectomy on 05/05/18.  Presents today for follow up.  She's been doing very well without any nausea, vomiting, or worsening pain.  She has occasional loose stools.  No issues with the inicions  Vital signs: BP 122/78   Pulse 74   Temp (!) 97.5 F (36.4 C)   Ht 5\' 6"  (1.676 m)   Wt 247 lb (112 kg)   LMP 05/15/2018 (Exact Date)   BMI 39.87 kg/m    Physical Exam: Constitutional: No acute distress Abdomen:  Soft, nondistended, nontender to palpation.  Incisions are clean, dry, intact without any evidence of infection  Assessment/Plan: This is a 28 y.o. female s/p laparoscopic cholecystectomy.  --Reviewed pathology with patient.  Chronic cholecystitis with cholelithiasis, reactive cystic duct lymph node, and negative for high-grade dysplasia and malignancy. --Reminded the patient she still has two more weeks of no heavy lifting or pushing of no more than 10-15 lbs.  She may return to normal activities afterwards --May follow up prn.   Howie Ill, MD [facility]

## 2019-06-27 IMAGING — CR DG CHEST 2V
2 series · 2 of 2 positions shown · non-contrast
Comparison: None.

CLINICAL DATA: Central chest pain.

EXAM:
CHEST - 2 VIEW

[chest pa]
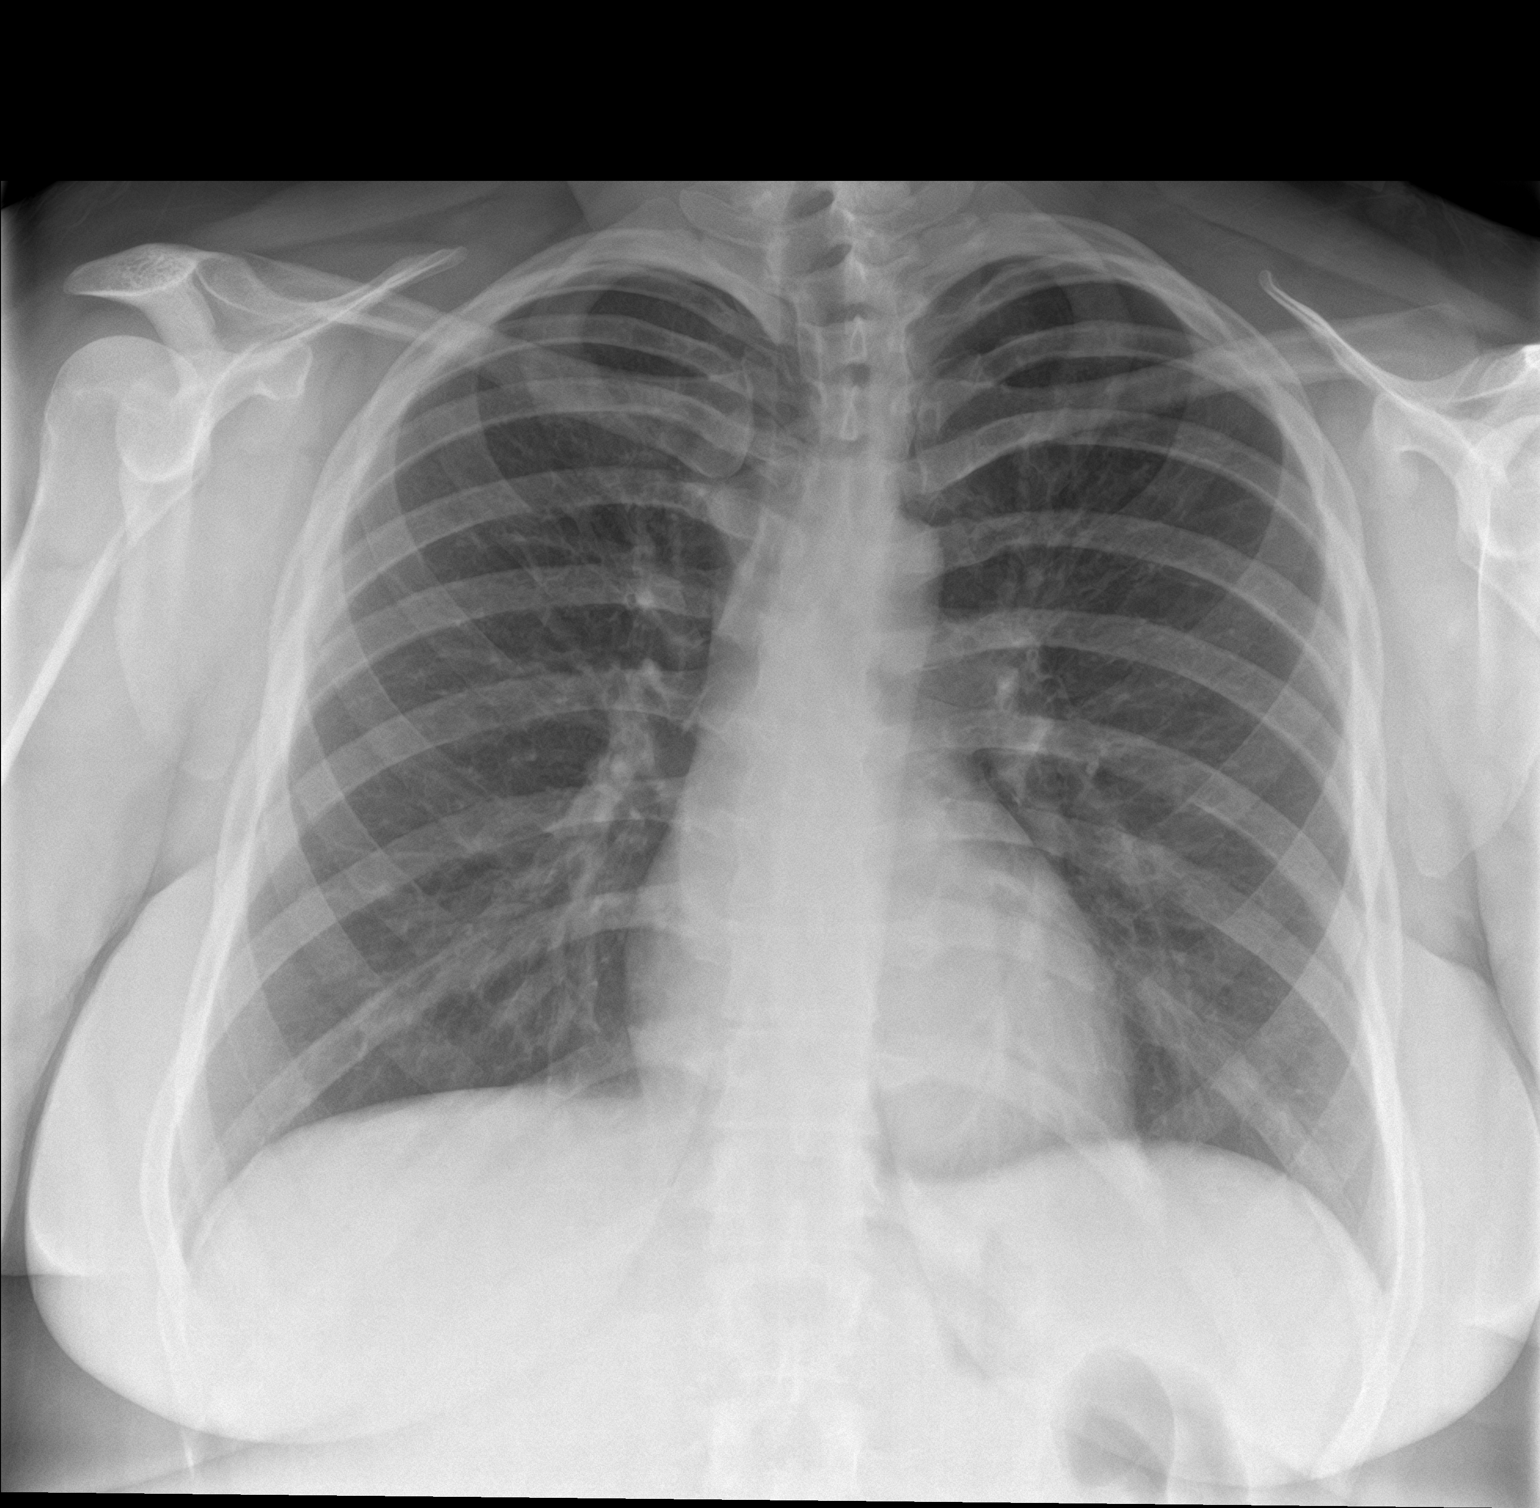

[chest lat]
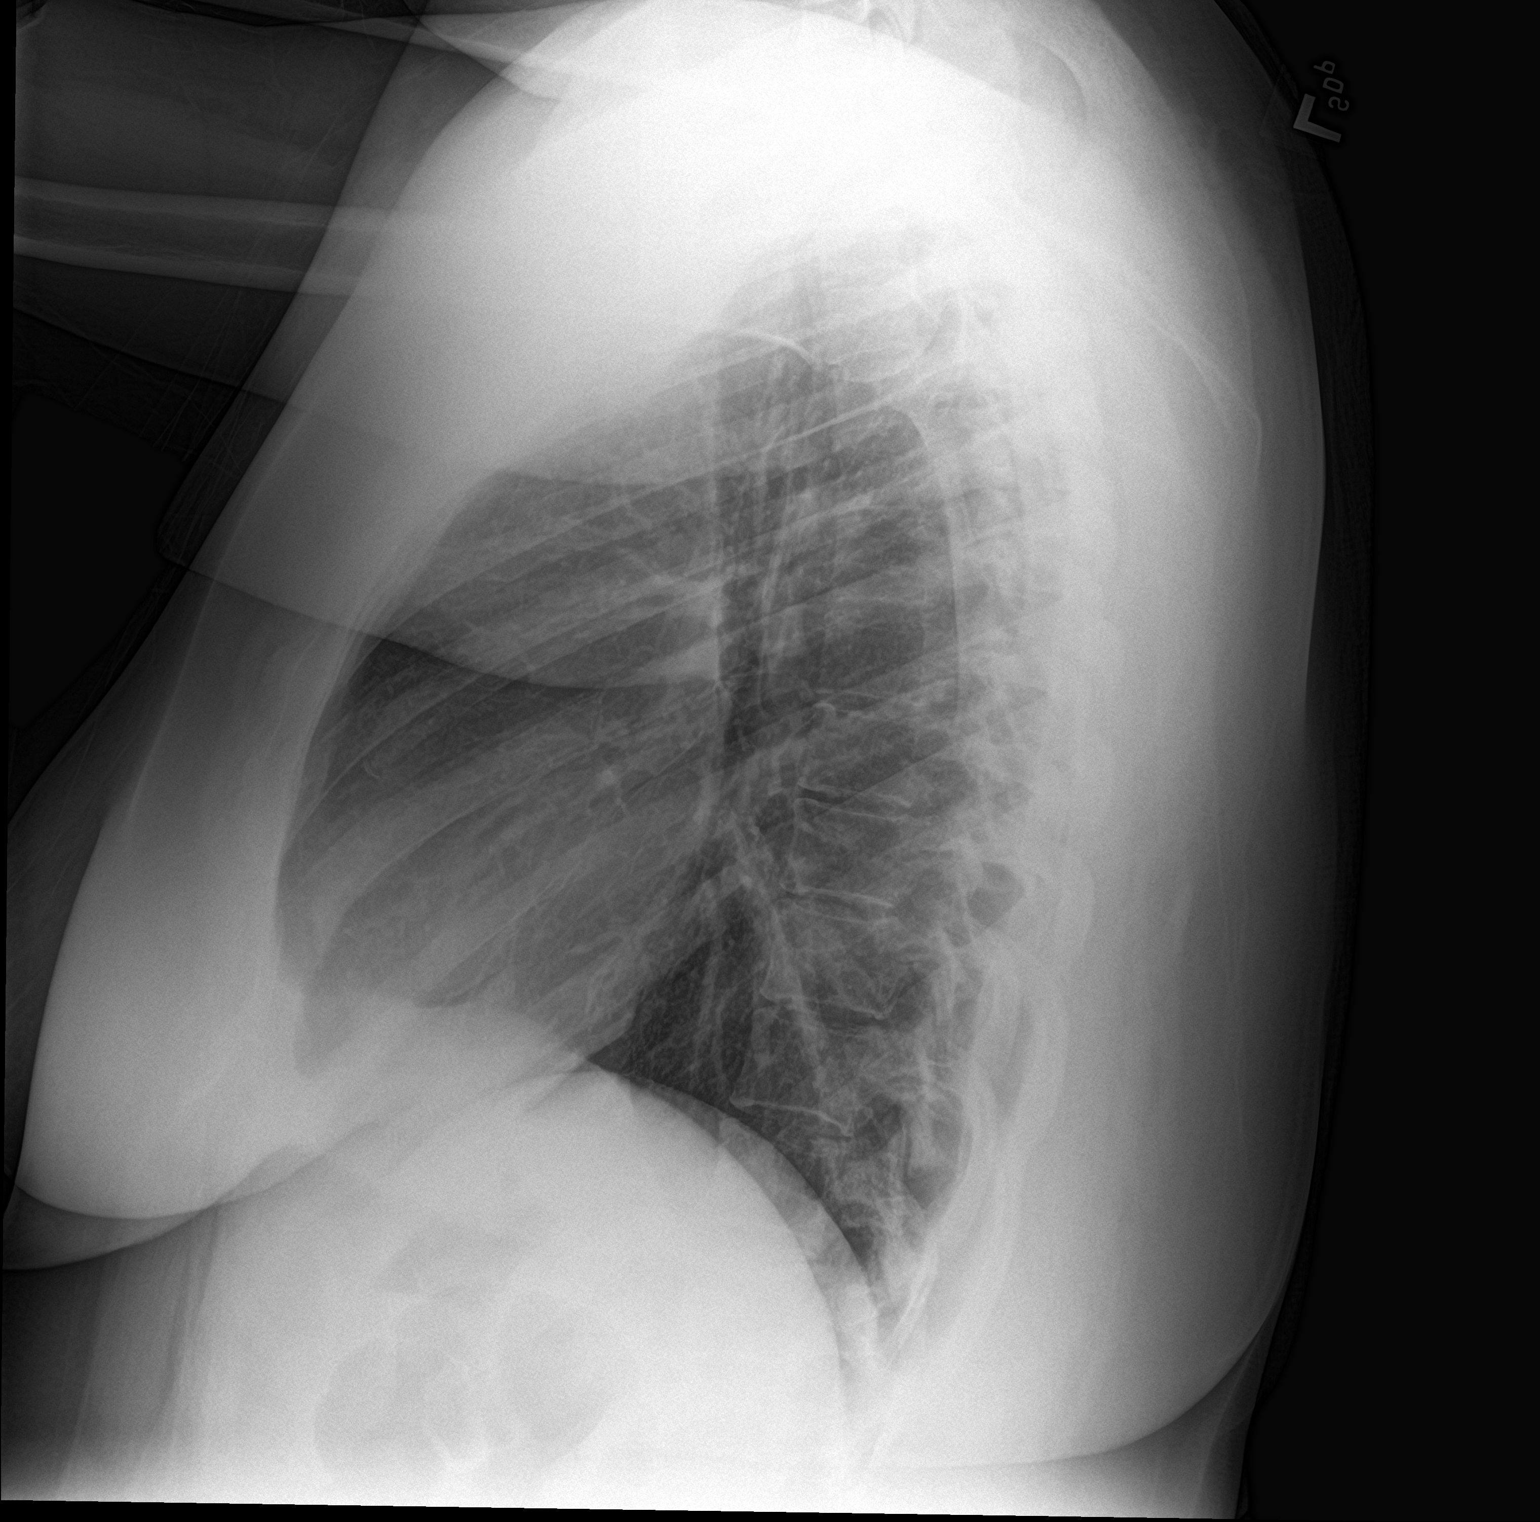

[2 of 2 positions shown; findings below may reference images not displayed]

FINDINGS: The heart size and mediastinal contours are within normal limits.
Both lungs are clear. There is scoliosis of spine.
IMPRESSION: No active cardiopulmonary disease.

## 2020-03-05 IMAGING — US US ABDOMEN LIMITED
1 series · 14 of 25 positions shown · non-contrast
Comparison: None.

CLINICAL DATA: Chronic right upper quadrant abdominal pain.

EXAM:
ULTRASOUND ABDOMEN LIMITED RIGHT UPPER QUADRANT

[Series 1: us abdomen limited · 0.25mm/px · 14 of 41 slices shown]
[im 1/41]
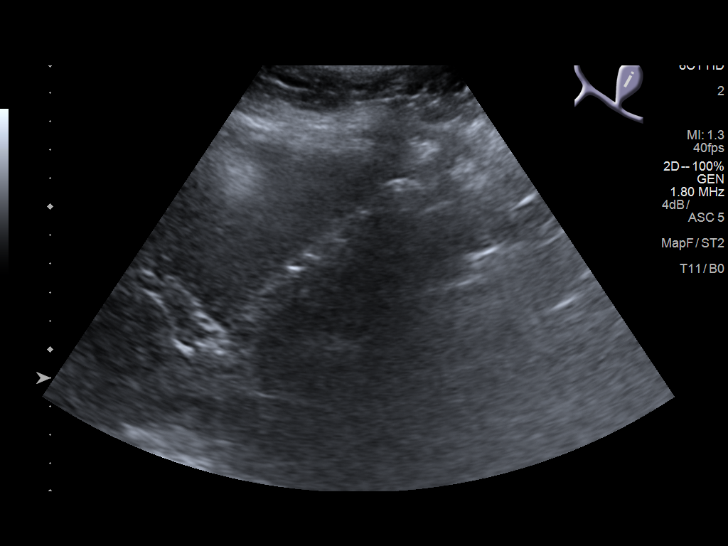
[im 4/41]
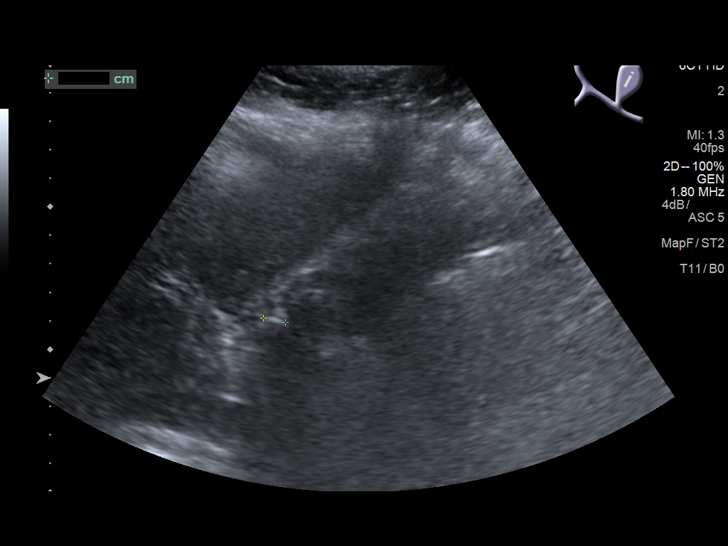
[im 7/41]
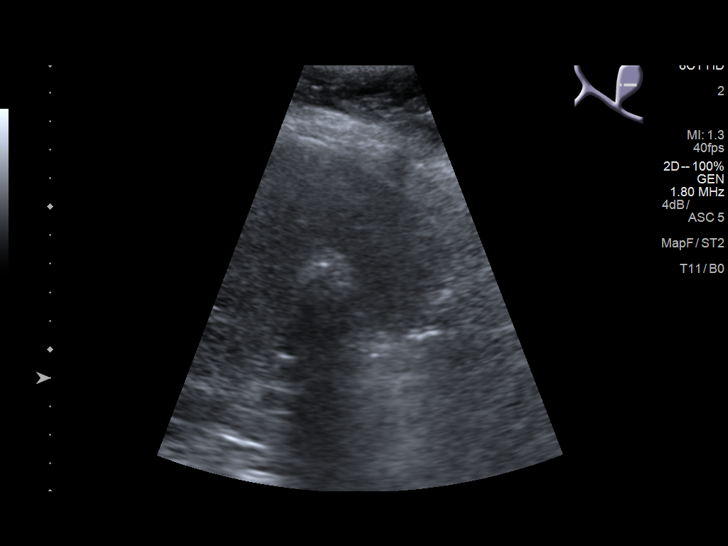
[im 11/41]
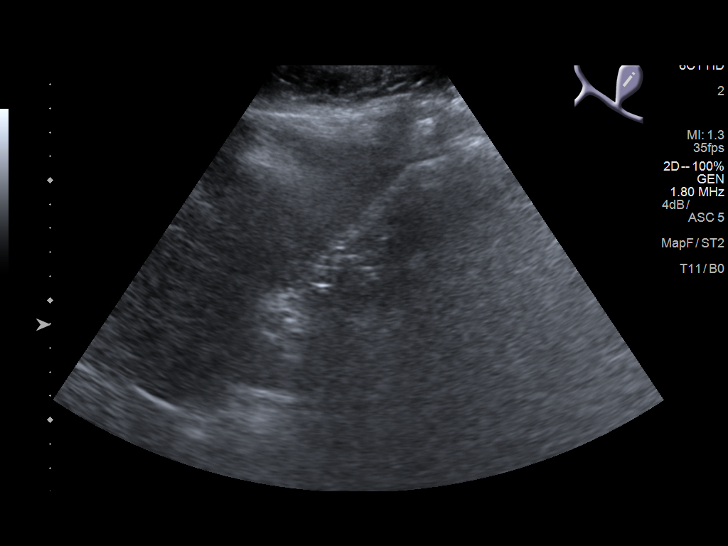
[im 14/41]
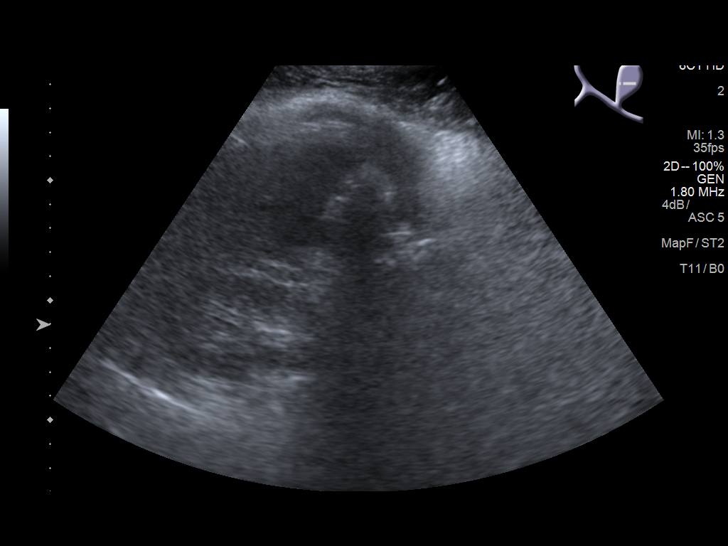
[im 16/41]
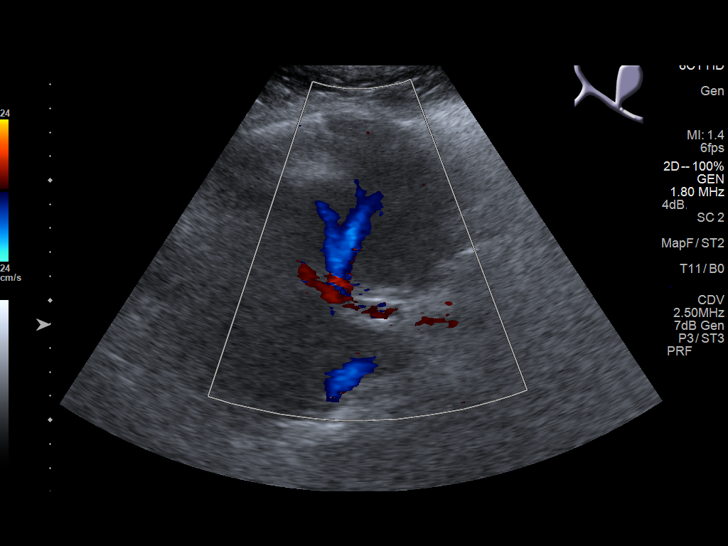
[im 19/41]
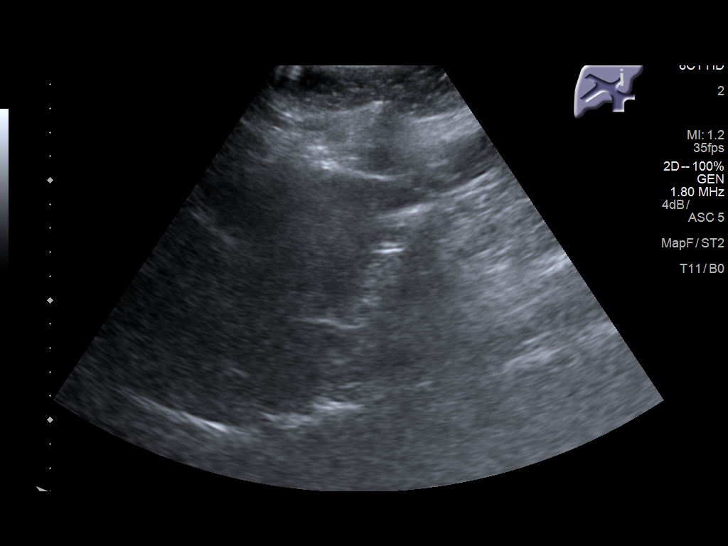
[im 22/41]
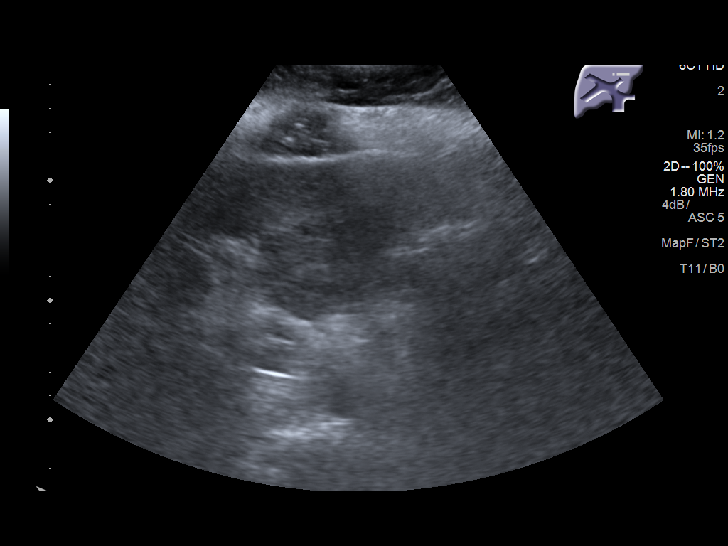
[im 26/41]
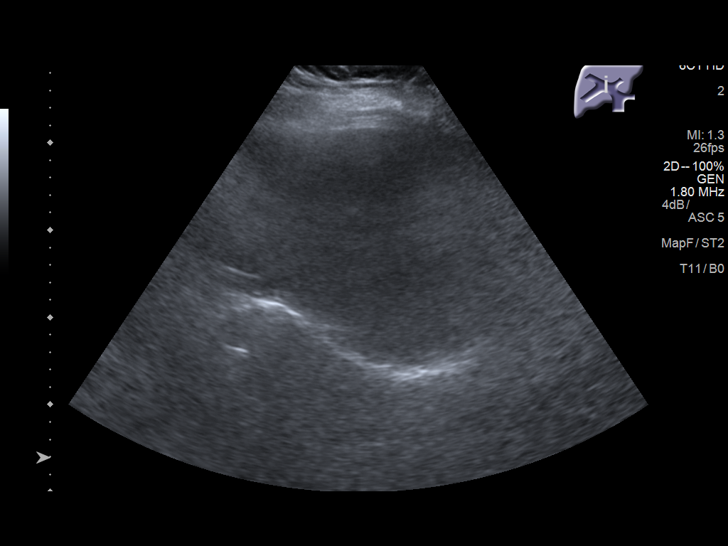
[im 27/41]
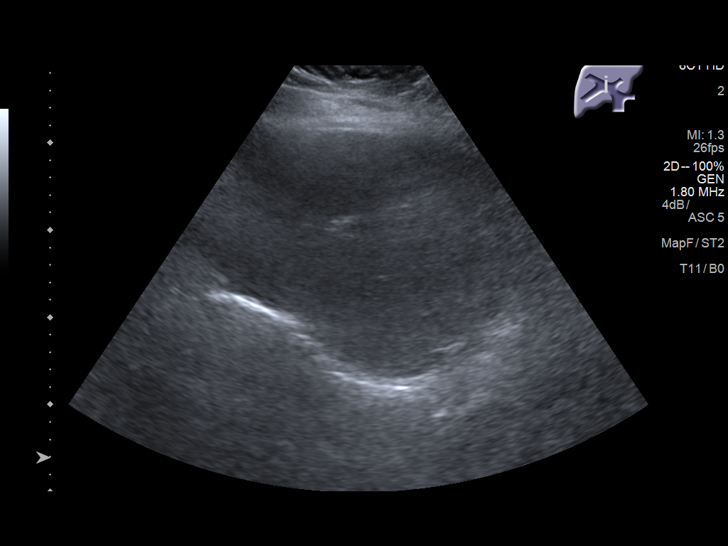
[im 31/41]
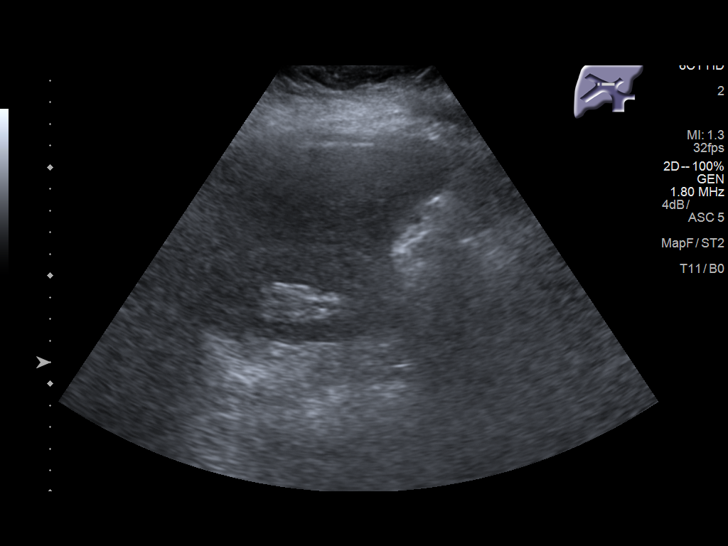
[im 34/41]
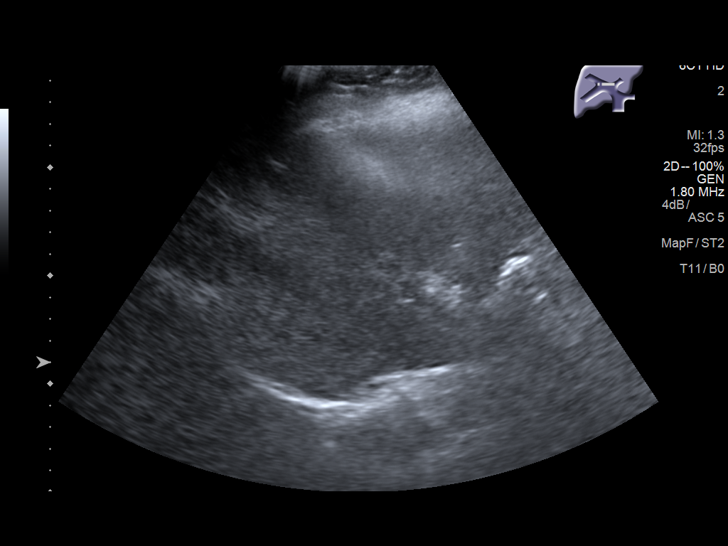
[im 37/41]
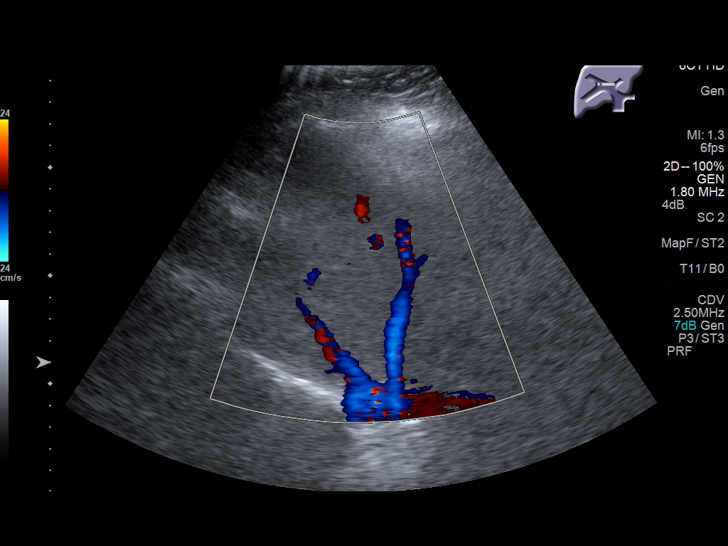
[im 41/41]
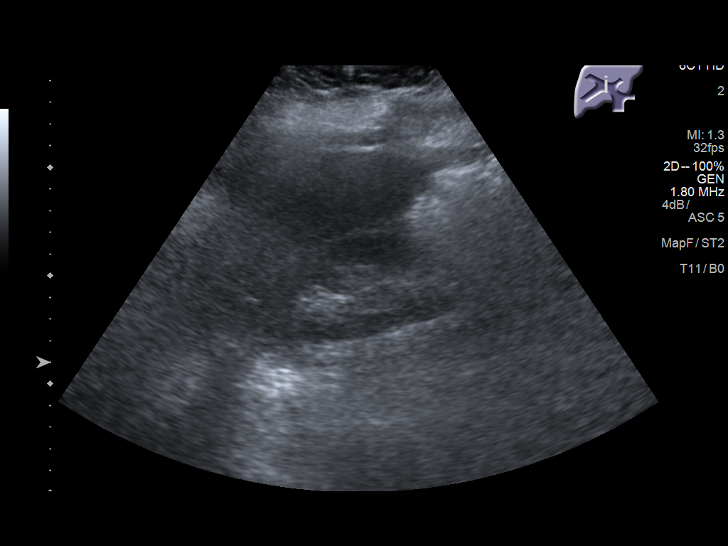

[14 of 25 positions shown; findings below may reference images not displayed]

FINDINGS: Gallbladder:

Numerous stones are seen filling the gallbladder, with a wall echo
shadow sign. No gallbladder wall thickening or pericholecystic fluid
is seen. No ultrasonographic Murphy's sign is elicited.

Common bile duct:

Diameter: 0.5 cm, within normal limits in caliber.

Liver:

No focal lesion identified. Increased parenchymal echogenicity and
coarsened echotexture likely reflect fatty infiltration. Portal vein
is patent on color Doppler imaging with normal direction of blood
flow towards the liver.
IMPRESSION: 1. No acute abnormality seen to explain the patient's symptoms.
2. Stones seen filling the gallbladder, with a wall echo shadow
sign. No definite evidence for obstruction or cholecystitis.
3. Diffuse fatty infiltration within the liver.
